# Patient Record
Sex: Female | Born: 2000 | Race: White | Hispanic: No | Smoking: Never smoker
Health system: Southern US, Community
[De-identification: ages and names within clinical notes are randomized; demographics above are authoritative.]

## PROBLEM LIST (undated history)

## (undated) DIAGNOSIS — F32A Depression, unspecified: Secondary | ICD-10-CM

## (undated) HISTORY — DX: Depression, unspecified: F32.A

---
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
## 2021-02-19 ENCOUNTER — Other Ambulatory Visit: Payer: Self-pay

## 2021-02-19 ENCOUNTER — Ambulatory Visit (INDEPENDENT_AMBULATORY_CARE_PROVIDER_SITE_OTHER): Payer: Medicaid Other | Admitting: *Deleted

## 2021-02-19 DIAGNOSIS — Z309 Encounter for contraceptive management, unspecified: Secondary | ICD-10-CM

## 2021-02-19 DIAGNOSIS — Z3042 Encounter for surveillance of injectable contraceptive: Secondary | ICD-10-CM

## 2021-05-08 ENCOUNTER — Other Ambulatory Visit: Payer: Self-pay

## 2021-05-08 ENCOUNTER — Ambulatory Visit: Payer: Self-pay

## 2021-05-08 ENCOUNTER — Ambulatory Visit (INDEPENDENT_AMBULATORY_CARE_PROVIDER_SITE_OTHER): Payer: Medicaid Other

## 2021-05-08 DIAGNOSIS — Z23 Encounter for immunization: Secondary | ICD-10-CM | POA: Diagnosis not present

## 2021-05-08 DIAGNOSIS — Z3042 Encounter for surveillance of injectable contraceptive: Secondary | ICD-10-CM | POA: Diagnosis not present

## 2021-05-08 DIAGNOSIS — Z309 Encounter for contraceptive management, unspecified: Secondary | ICD-10-CM

## 2021-05-08 NOTE — Progress Notes (Signed)
Medroxyprogesterone injection given to left upper outer quadrant.  Patient tolerated well. 

## 2021-07-17 ENCOUNTER — Other Ambulatory Visit: Payer: Self-pay | Admitting: Family Medicine

## 2021-07-17 DIAGNOSIS — F4323 Adjustment disorder with mixed anxiety and depressed mood: Secondary | ICD-10-CM

## 2021-07-17 DIAGNOSIS — Z3009 Encounter for other general counseling and advice on contraception: Secondary | ICD-10-CM

## 2021-07-17 DIAGNOSIS — N921 Excessive and frequent menstruation with irregular cycle: Secondary | ICD-10-CM

## 2021-07-17 NOTE — Telephone Encounter (Signed)
Pt aware and scheduled for appt in jan

## 2021-07-17 NOTE — Telephone Encounter (Signed)
Overdue for checkup.  Please make sure she schedules

## 2021-07-25 ENCOUNTER — Ambulatory Visit (INDEPENDENT_AMBULATORY_CARE_PROVIDER_SITE_OTHER): Payer: Medicaid Other

## 2021-07-25 DIAGNOSIS — Z3042 Encounter for surveillance of injectable contraceptive: Secondary | ICD-10-CM | POA: Diagnosis not present

## 2021-07-25 DIAGNOSIS — Z309 Encounter for contraceptive management, unspecified: Secondary | ICD-10-CM

## 2021-07-25 NOTE — Progress Notes (Signed)
Medroxyprogesterone injection given to right upper outer quadrant.  Patient tolerated well. 

## 2021-08-21 ENCOUNTER — Ambulatory Visit (INDEPENDENT_AMBULATORY_CARE_PROVIDER_SITE_OTHER): Payer: Medicaid Other | Admitting: Family Medicine

## 2021-08-21 ENCOUNTER — Encounter: Payer: Self-pay | Admitting: Family Medicine

## 2021-08-21 VITALS — BP 124/76 | HR 116 | Temp 97.9°F | Ht 62.0 in | Wt 110.0 lb

## 2021-08-21 DIAGNOSIS — F411 Generalized anxiety disorder: Secondary | ICD-10-CM

## 2021-08-21 DIAGNOSIS — F321 Major depressive disorder, single episode, moderate: Secondary | ICD-10-CM | POA: Diagnosis not present

## 2021-08-21 DIAGNOSIS — Z3042 Encounter for surveillance of injectable contraceptive: Secondary | ICD-10-CM

## 2021-08-21 MED ORDER — FLUOXETINE HCL 40 MG PO CAPS: 80.0000 mg | ORAL_CAPSULE | Freq: Every day | ORAL | 3 refills | Status: DC

## 2021-08-21 NOTE — Progress Notes (Signed)
Subjective: CC: Depression and anxiety PCP: Raliegh Ip, DO AGT:XMIWOE Julie Romero is a 21 y.o. female presenting to clinic today for:  1.  Depression/ anxiety Patient notes that she started having increased frequency of anxiety and she self increased to Prozac 80 mg.  She is been on this regimen for about a week now and has really already noticed an improvement in her symptoms.  She would like to continue this regimen if possible.  Denies any GI side effects, SI, HI  2.  Contraception surveillance Patient is compliant with her Depo-Provera.  Next injection due in March.  She has had no menses since being on this medication.  ROS: Per HPI  No Known Allergies Past Medical History:  Diagnosis Date   Depression    Phreesia 08/21/2020    Current Outpatient Medications:    FLUoxetine (PROZAC) 40 MG capsule, TAKE 1 CAPSULE BY MOUTH EVERY DAY, Disp: 90 capsule, Rfl: 3   medroxyPROGESTERone (DEPO-PROVERA) 150 MG/ML injection, BRING TO OFFICE TO HAVE INJECTED EVERY 3 MONTHS., Disp: 1 mL, Rfl: 3 Social History   Socioeconomic History   Marital status: Single    Spouse name: Not on file   Number of children: Not on file   Years of education: Not on file   Highest education level: Not on file  Occupational History   Not on file  Tobacco Use   Smoking status: Never   Smokeless tobacco: Never  Substance and Sexual Activity   Alcohol use: Not on file   Drug use: Not on file   Sexual activity: Yes    Partners: Female  Other Topics Concern   Not on file  Social History Narrative   Not on file   Social Determinants of Health   Financial Resource Strain: Not on file  Food Insecurity: Not on file  Transportation Needs: Not on file  Physical Activity: Not on file  Stress: Not on file  Social Connections: Not on file  Intimate Partner Violence: Not on file   No family history on file.  Objective: Office vital signs reviewed. BP 124/76    Pulse (!) 116    Temp 97.9 F  (36.6 C) (Temporal)    Ht 5\' 2"  (1.575 m)    Wt 110 lb (49.9 kg)    BMI 20.12 kg/m   Physical Examination:  General: Awake, alert, well-appearing female, No acute distress HEENT sclera white. Cardio: Slightly tachycardic with regular rhythm.  S1S2 heard, no murmurs appreciated Pulm: clear to auscultation bilaterally, no wheezes, rhonchi or rales; normal work of breathing on room air GI: soft, non-tender, non-distended, bowel sounds present x4, no hepatomegaly, no splenomegaly, no masses GU: No palpable uterine or adnexal masses appreciated externally  Depression screen [facility] 2/9 08/21/2020 03/15/2019 01/28/2019  Decreased Interest 0 1 2  Down, Depressed, Hopeless 1 2 3   PHQ - 2 Score 1 3 5   Altered sleeping 0 2 2  Tired, decreased energy 0 1 1  Change in appetite 0 3 2  Feeling bad or failure about yourself  1 3 3   Trouble concentrating 1 1 2   Moving slowly or fidgety/restless 0 1 2  Suicidal thoughts 0 - 2  PHQ-9 Score 3 14 19   Difficult doing work/chores Not difficult at all - Very difficult  Some recent data might be hidden   GAD 7 : Generalized Anxiety Score 08/21/2020 03/15/2019 11/02/2018 10/05/2018  Nervous, Anxious, on Edge 1 3 3 3   Control/stop worrying 1 3 3 3   Worry  too much - different things 1 3 3 3   Trouble relaxing 0 2 2 2   Restless 0 2 2 2   Easily annoyed or irritable 0 1 0 1  Afraid - awful might happen 0 1 2 2   Total GAD 7 Score 3 15 15 16   Anxiety Difficulty Not difficult at all - - -   Assessment/ Plan: 21 y.o. female   Depression, major, single episode, moderate (HCC) - Plan: FLUoxetine (PROZAC) 40 MG capsule  Generalized anxiety disorder - Plan: FLUoxetine (PROZAC) 40 MG capsule  Surveillance of contraceptive injection  Anxiety and depression recently exacerbated but seems to be getting better with increased dose of fluoxetine.  Have adjusted her prescription to reflect current dose and have encouraged her to contact me should any concerns arise going  forward.  Doing well on Depo-Provera.  This has been renewed.  No orders of the defined types were placed in this encounter.  No orders of the defined types were placed in this encounter.    , DO [facility] [phone]

## 2021-10-11 ENCOUNTER — Ambulatory Visit (INDEPENDENT_AMBULATORY_CARE_PROVIDER_SITE_OTHER): Payer: Medicaid Other

## 2021-10-11 DIAGNOSIS — Z3042 Encounter for surveillance of injectable contraceptive: Secondary | ICD-10-CM | POA: Diagnosis not present

## 2021-10-11 DIAGNOSIS — Z309 Encounter for contraceptive management, unspecified: Secondary | ICD-10-CM

## 2021-10-11 MED ORDER — MEDROXYPROGESTERONE ACETATE 150 MG/ML IM SUSP
150.0000 mg | Freq: Once | INTRAMUSCULAR | Status: AC
  Administered 2021-10-11: 150 mg via INTRAMUSCULAR

## 2021-10-11 NOTE — Progress Notes (Signed)
Pt give depo provera inj LUOQ 150mg . Pt tol tx well ?

## 2021-12-11 ENCOUNTER — Ambulatory Visit: Payer: Medicaid Other

## 2022-01-01 ENCOUNTER — Ambulatory Visit (INDEPENDENT_AMBULATORY_CARE_PROVIDER_SITE_OTHER): Payer: Medicaid Other

## 2022-01-01 DIAGNOSIS — Z3042 Encounter for surveillance of injectable contraceptive: Secondary | ICD-10-CM | POA: Diagnosis not present

## 2022-01-01 DIAGNOSIS — Z309 Encounter for contraceptive management, unspecified: Secondary | ICD-10-CM

## 2022-01-01 MED ORDER — MEDROXYPROGESTERONE ACETATE 150 MG/ML IM SUSP
150.0000 mg | Freq: Once | INTRAMUSCULAR | Status: AC
  Administered 2022-01-01: 150 mg via INTRAMUSCULAR

## 2022-03-25 ENCOUNTER — Ambulatory Visit (INDEPENDENT_AMBULATORY_CARE_PROVIDER_SITE_OTHER): Payer: Medicaid Other

## 2022-03-25 DIAGNOSIS — Z309 Encounter for contraceptive management, unspecified: Secondary | ICD-10-CM

## 2022-03-25 DIAGNOSIS — Z3042 Encounter for surveillance of injectable contraceptive: Secondary | ICD-10-CM

## 2022-03-25 MED ORDER — MEDROXYPROGESTERONE ACETATE 150 MG/ML IM SUSP
150.0000 mg | INTRAMUSCULAR | Status: AC
  Administered 2022-03-25 – 2022-11-18 (×4): 150 mg via INTRAMUSCULAR

## 2022-03-25 NOTE — Progress Notes (Signed)
Medroxyprogesterone injection given to left upper outer quadrant.  Patient tolerated well. 

## 2022-06-07 ENCOUNTER — Other Ambulatory Visit: Payer: Self-pay | Admitting: Family Medicine

## 2022-06-07 DIAGNOSIS — Z3009 Encounter for other general counseling and advice on contraception: Secondary | ICD-10-CM

## 2022-06-07 DIAGNOSIS — N921 Excessive and frequent menstruation with irregular cycle: Secondary | ICD-10-CM

## 2022-06-11 ENCOUNTER — Ambulatory Visit (INDEPENDENT_AMBULATORY_CARE_PROVIDER_SITE_OTHER): Payer: Medicaid Other

## 2022-06-11 DIAGNOSIS — Z309 Encounter for contraceptive management, unspecified: Secondary | ICD-10-CM

## 2022-06-11 DIAGNOSIS — Z3042 Encounter for surveillance of injectable contraceptive: Secondary | ICD-10-CM | POA: Diagnosis not present

## 2022-06-11 NOTE — Progress Notes (Signed)
Medroxyprogesterone injection given to right upper outer quadrant.  Patient tolerated well. 

## 2022-08-28 ENCOUNTER — Ambulatory Visit (INDEPENDENT_AMBULATORY_CARE_PROVIDER_SITE_OTHER): Payer: Medicaid Other | Admitting: *Deleted

## 2022-08-28 DIAGNOSIS — Z3042 Encounter for surveillance of injectable contraceptive: Secondary | ICD-10-CM | POA: Diagnosis not present

## 2022-08-28 DIAGNOSIS — Z309 Encounter for contraceptive management, unspecified: Secondary | ICD-10-CM

## 2022-08-28 NOTE — Progress Notes (Signed)
Medroxyprogesterone given on left side and patient tolerated well

## 2022-08-28 NOTE — Patient Instructions (Signed)

## 2022-11-18 ENCOUNTER — Ambulatory Visit (INDEPENDENT_AMBULATORY_CARE_PROVIDER_SITE_OTHER): Payer: Self-pay

## 2022-11-18 DIAGNOSIS — Z309 Encounter for contraceptive management, unspecified: Secondary | ICD-10-CM

## 2022-11-18 DIAGNOSIS — Z3042 Encounter for surveillance of injectable contraceptive: Secondary | ICD-10-CM

## 2022-11-18 NOTE — Progress Notes (Signed)
Medroxyprogesterone injection given to right upper outer quadrant.  Patient tolerated well.   

## 2023-01-31 ENCOUNTER — Other Ambulatory Visit: Payer: Self-pay | Admitting: Family Medicine

## 2023-01-31 DIAGNOSIS — Z3009 Encounter for other general counseling and advice on contraception: Secondary | ICD-10-CM

## 2023-01-31 DIAGNOSIS — N921 Excessive and frequent menstruation with irregular cycle: Secondary | ICD-10-CM

## 2023-02-04 ENCOUNTER — Encounter: Payer: Self-pay | Admitting: Nurse Practitioner

## 2023-02-04 ENCOUNTER — Ambulatory Visit (INDEPENDENT_AMBULATORY_CARE_PROVIDER_SITE_OTHER): Payer: Self-pay

## 2023-02-04 ENCOUNTER — Telehealth: Payer: Self-pay | Admitting: *Deleted

## 2023-02-04 ENCOUNTER — Ambulatory Visit (INDEPENDENT_AMBULATORY_CARE_PROVIDER_SITE_OTHER): Payer: Self-pay | Admitting: Nurse Practitioner

## 2023-02-04 VITALS — BP 121/75 | HR 77 | Temp 97.7°F | Ht 62.0 in | Wt 123.8 lb

## 2023-02-04 DIAGNOSIS — E041 Nontoxic single thyroid nodule: Secondary | ICD-10-CM

## 2023-02-04 DIAGNOSIS — Z309 Encounter for contraceptive management, unspecified: Secondary | ICD-10-CM

## 2023-02-04 DIAGNOSIS — Z3200 Encounter for pregnancy test, result unknown: Secondary | ICD-10-CM

## 2023-02-04 DIAGNOSIS — Z3042 Encounter for surveillance of injectable contraceptive: Secondary | ICD-10-CM

## 2023-02-04 LAB — PREGNANCY, URINE: Preg Test, Ur: NEGATIVE

## 2023-02-04 MED ORDER — MEDROXYPROGESTERONE ACETATE 150 MG/ML IM SUSP
150.0000 mg | INTRAMUSCULAR | Status: AC
  Administered 2023-02-04 – 2023-09-24 (×4): 150 mg via INTRAMUSCULAR

## 2023-02-04 MED ORDER — MEDROXYPROGESTERONE ACETATE 104 MG/0.65ML ~~LOC~~ SUSY: 104.0000 mg | PREFILLED_SYRINGE | Freq: Once | SUBCUTANEOUS | Status: DC

## 2023-02-04 NOTE — Progress Notes (Signed)
   Acute Office Visit  Subjective:     Patient ID: Julie Romero, female    DOB: 20-Oct-2000, 22 y.o.   MRN: [id number]  Chief Complaint  Patient presents with   knot on throat    Noticed knot on right side of throat last week.     HPI Julie Romero is a 22 yrs old  female present here for an acute  for concerns of thyrid nodule. PMH depression and GAD currently on Prozac.  "my finace and my famly noticed a nodule on the right side of my thyroid". She denies, trouble swallowing., difficulty breathing, when lying down., neck pain or jaw or ear pain. She has been getting Depo shot for about 4 yrs, HCG negative.  ROS Negative unless indicated in HPI    Objective:    BP 121/75   Pulse 77   Temp 97.7 F (36.5 C) (Temporal)   Ht 5\' 2"  (1.575 m)   Wt 123 lb 12.8 oz (56.2 kg)   SpO2 97%   BMI 22.64 kg/m  BP Readings from Last 3 Encounters:  02/04/23 121/75  08/21/21 124/76  08/21/20 116/76   Wt Readings from Last 3 Encounters:  02/04/23 123 lb 12.8 oz (56.2 kg)  08/21/21 110 lb (49.9 kg) (16 %, Z= -0.99)*  08/21/20 102 lb (46.3 kg) (7 %, Z= -1.50)*   * Growth percentiles are based on CDC (Girls, 2-20 Years) data.      Physical Exam Vitals and nursing note reviewed.  Constitutional:      Appearance: Normal appearance.  HENT:     Head: Normocephalic and atraumatic.  Eyes:     Extraocular Movements: Extraocular movements intact.     Pupils: Pupils are equal, round, and reactive to light.  Neck:     Thyroid: No thyroid tenderness.     Trachea: Trachea normal.      Comments: <1cm nodule appreciated Cardiovascular:     Rate and Rhythm: Normal rate.     Heart sounds: Normal heart sounds.  Pulmonary:     Effort: Pulmonary effort is normal.     Breath sounds: Normal breath sounds.  Musculoskeletal:        General: Normal range of motion.     Cervical back: Normal range of motion.  Skin:    General: Skin is warm and dry.     Findings: No rash.  Neurological:      General: No focal deficit present.     Mental Status: She is alert and oriented to person, place, and time. Mental status is at baseline.     No results found for any visits on 02/04/23.      Assessment & Plan:  Encounter for pregnancy test, result unknown -     Pregnancy, urine -     Thyroid Panel With TSH  Right thyroid nodule -     Thyroid Panel With TSH  Well 22 yrs old no acute distress TSH panel ordered, based on results may need thyroid US - MDD/Gad well controled on Prozac Plan of care discuss with client Return in about 2 weeks (around 02/18/2023) for follow-up to dsicuss lab results.  Arrie Aran Santa Lighter, DNP [facility] [address] [place], [place] [address] [phone]

## 2023-02-04 NOTE — Telephone Encounter (Signed)
She can come in for refill appt.

## 2023-02-04 NOTE — Telephone Encounter (Signed)
Pt here today for Depo shot, Next shot will be in Sept Her last PE was 08/2021, not able to schedule her until 08/2023 Please advise on refills on her Depo Provera for future shots until her PE appt.

## 2023-02-05 LAB — THYROID PANEL WITH TSH
Free Thyroxine Index: 2.1 (ref 1.2–4.9)
T3 Uptake Ratio: 29 % (ref 24–39)
T4, Total: 7.4 ug/dL (ref 4.5–12.0)
TSH: 1.26 u[IU]/mL (ref 0.450–4.500)

## 2023-02-23 ENCOUNTER — Encounter: Payer: Self-pay | Admitting: Family Medicine

## 2023-02-23 ENCOUNTER — Ambulatory Visit (INDEPENDENT_AMBULATORY_CARE_PROVIDER_SITE_OTHER): Payer: Self-pay | Admitting: Family Medicine

## 2023-02-23 ENCOUNTER — Other Ambulatory Visit: Payer: Self-pay | Admitting: Family Medicine

## 2023-02-23 DIAGNOSIS — F411 Generalized anxiety disorder: Secondary | ICD-10-CM

## 2023-02-23 DIAGNOSIS — F321 Major depressive disorder, single episode, moderate: Secondary | ICD-10-CM

## 2023-02-23 DIAGNOSIS — N921 Excessive and frequent menstruation with irregular cycle: Secondary | ICD-10-CM

## 2023-02-23 DIAGNOSIS — Z3009 Encounter for other general counseling and advice on contraception: Secondary | ICD-10-CM

## 2023-02-23 MED ORDER — FLUOXETINE HCL 40 MG PO CAPS
80.0000 mg | ORAL_CAPSULE | Freq: Every day | ORAL | 3 refills | Status: DC
Start: 2023-02-23 — End: 2023-09-04

## 2023-02-23 MED ORDER — MEDROXYPROGESTERONE ACETATE 150 MG/ML IM SUSP
INTRAMUSCULAR | 4 refills | Status: DC
Start: 2023-02-23 — End: 2023-09-04

## 2023-02-23 NOTE — Progress Notes (Signed)
Did not need to be seen.  Just needed refills.  Self pay.    Recent Results (from the past 2160 hour(s))  Pregnancy, urine     Status: None   Collection Time: 02/04/23  8:10 AM  Result Value Ref Range   Preg Test, Ur Negative Negative  Thyroid Panel With TSH     Status: None   Collection Time: 02/04/23  8:20 AM  Result Value Ref Range   TSH 1.260 0.450 - 4.500 uIU/mL   T4, Total 7.4 4.5 - 12.0 ug/dL   T3 Uptake Ratio 29 24 - 39 %   Free Thyroxine Index 2.1 1.2 - 4.9

## 2023-04-22 ENCOUNTER — Ambulatory Visit (INDEPENDENT_AMBULATORY_CARE_PROVIDER_SITE_OTHER): Payer: 59

## 2023-04-22 DIAGNOSIS — Z23 Encounter for immunization: Secondary | ICD-10-CM | POA: Diagnosis not present

## 2023-04-22 DIAGNOSIS — Z309 Encounter for contraceptive management, unspecified: Secondary | ICD-10-CM

## 2023-04-22 DIAGNOSIS — Z3042 Encounter for surveillance of injectable contraceptive: Secondary | ICD-10-CM | POA: Diagnosis not present

## 2023-04-22 NOTE — Progress Notes (Signed)
Pt given depo and flu shot. Tolerated well.

## 2023-07-08 ENCOUNTER — Ambulatory Visit (INDEPENDENT_AMBULATORY_CARE_PROVIDER_SITE_OTHER): Payer: 59 | Admitting: *Deleted

## 2023-07-08 DIAGNOSIS — Z3042 Encounter for surveillance of injectable contraceptive: Secondary | ICD-10-CM | POA: Diagnosis not present

## 2023-07-08 DIAGNOSIS — Z309 Encounter for contraceptive management, unspecified: Secondary | ICD-10-CM

## 2023-07-08 NOTE — Progress Notes (Signed)
Patient is in office today for a nurse visit for Birth Control Injection. Patient Injection was given in the  Right upper quad. gluteus. Patient tolerated injection well.

## 2023-09-04 ENCOUNTER — Other Ambulatory Visit (HOSPITAL_COMMUNITY)
Admission: RE | Admit: 2023-09-04 | Discharge: 2023-09-04 | Disposition: A | Discharge status: Home / Self Care | Payer: 59 | Source: Ambulatory Visit | Attending: Family Medicine | Admitting: Family Medicine

## 2023-09-04 ENCOUNTER — Encounter: Payer: Self-pay | Admitting: Family Medicine

## 2023-09-04 ENCOUNTER — Ambulatory Visit (INDEPENDENT_AMBULATORY_CARE_PROVIDER_SITE_OTHER): Payer: 59 | Admitting: Family Medicine

## 2023-09-04 VITALS — BP 122/77 | HR 73 | Temp 98.3°F | Ht 62.0 in | Wt 126.8 lb

## 2023-09-04 DIAGNOSIS — N921 Excessive and frequent menstruation with irregular cycle: Secondary | ICD-10-CM | POA: Diagnosis not present

## 2023-09-04 DIAGNOSIS — Z124 Encounter for screening for malignant neoplasm of cervix: Secondary | ICD-10-CM | POA: Diagnosis not present

## 2023-09-04 DIAGNOSIS — F321 Major depressive disorder, single episode, moderate: Secondary | ICD-10-CM

## 2023-09-04 DIAGNOSIS — Z0000 Encounter for general adult medical examination without abnormal findings: Secondary | ICD-10-CM

## 2023-09-04 DIAGNOSIS — Z3009 Encounter for other general counseling and advice on contraception: Secondary | ICD-10-CM | POA: Diagnosis not present

## 2023-09-04 DIAGNOSIS — Z114 Encounter for screening for human immunodeficiency virus [HIV]: Secondary | ICD-10-CM | POA: Diagnosis not present

## 2023-09-04 DIAGNOSIS — Z0001 Encounter for general adult medical examination with abnormal findings: Secondary | ICD-10-CM | POA: Diagnosis not present

## 2023-09-04 DIAGNOSIS — Z1159 Encounter for screening for other viral diseases: Secondary | ICD-10-CM

## 2023-09-04 DIAGNOSIS — F411 Generalized anxiety disorder: Secondary | ICD-10-CM | POA: Diagnosis not present

## 2023-09-04 MED ORDER — MEDROXYPROGESTERONE ACETATE 150 MG/ML IM SUSP
INTRAMUSCULAR | 4 refills | Status: DC
Start: 2023-09-04 — End: 2024-03-07

## 2023-09-04 MED ORDER — FLUOXETINE HCL 40 MG PO CAPS
80.0000 mg | ORAL_CAPSULE | Freq: Every day | ORAL | 3 refills | Status: DC
Start: 2023-09-04 — End: 2024-06-30

## 2023-09-04 NOTE — Patient Instructions (Addendum)
I recommend screening Hep C and HIV testing You have labs in and you may get them done at your leisure.

## 2023-09-04 NOTE — Progress Notes (Signed)
Julie Romero is a 23 y.o. female presents to office today for annual physical exam examination.    Concerns today include: 1.  None.  She reports that she is doing well.  She bought a house with her boyfriend recently.  She is considering some family-planning but they have not gotten there just yet.  They are expecting to be married within the next year.  She is compliant with Depo-Provera.  Reports no pelvic concerns including discharge, abnormal uterine bleeding or dyspareunia.  Occupation: Works at [employer] locally, Marital status: Has a fianc, Substance use: None Health Maintenance Due  Topic Date Due   CHLAMYDIA SCREENING  Never done   HIV Screening  Never done   Hepatitis C Screening  Never done   Cervical Cancer Screening (Pap smear)  Never done   Refills needed today: All  Immunization History  Administered Date(s) Administered   DTaP 08/02/2002, 09/20/2002, 11/01/2002, 01/04/2004, 03/26/2007   HIB (PRP-OMP) 08/02/2002, 09/20/2002, 02/03/2003, 06/13/2003   HPV 9-valent 07/16/2015, 01/04/2016   HPV Quadrivalent 05/15/2015   Hepatitis A 03/26/2007   Hepatitis B 05/19/2002, 06/16/2002, 11/01/2002   Hpv-Unspecified 05/15/2015, 07/16/2015, 01/04/2016   IPV 08/02/2002, 09/20/2002, 02/03/2003, 03/26/2007   Influenza, Seasonal, Injecte, Preservative Fre 04/22/2023   Influenza,Quad,Nasal, Live 06/01/2013, 05/30/2014   Influenza,inj,Quad PF,6+ Mos 05/15/2015, 05/06/2016, 06/02/2017, 06/07/2018, 05/02/2019, 06/04/2020, 05/08/2021   Influenza-Unspecified 05/06/2016, 06/02/2017, 06/07/2018   MMR 06/13/2003, 03/26/2007   Meningococcal Conjugate 02/08/2015   Meningococcal Mcv4o 04/04/2020   Meningococcal polysaccharide vaccine (MPSV4) 02/08/2015   PFIZER(Purple Top)SARS-COV-2 Vaccination 11/24/2019, 12/16/2019, 08/12/2020   Pneumococcal Conjugate-13 08/02/2002, 09/20/2002, 11/01/2002, 06/13/2003   Tdap 02/08/2015   Varicella 06/13/2003, 03/26/2007   Past Medical History:   Diagnosis Date   Depression    [employer] 08/21/2020   Social History   Socioeconomic History   Marital status: Significant Other    Spouse name: Not on file   Number of children: Not on file   Years of education: Not on file   Highest education level: Not on file  Occupational History   Not on file  Tobacco Use   Smoking status: Never   Smokeless tobacco: Never  Vaping Use   Vaping status: Never Used  Substance and Sexual Activity   Alcohol use: Not on file   Drug use: Not on file   Sexual activity: Yes    Partners: Female    Birth control/protection: Injection  Other Topics Concern   Not on file  Social History Narrative   Not on file   Social Drivers of Health   Financial Resource Strain: Not on file  Food Insecurity: Not on file  Transportation Needs: Not on file  Physical Activity: Not on file  Stress: Not on file  Social Connections: Not on file  Intimate Partner Violence: Not on file   History reviewed. No pertinent surgical history. History reviewed. No pertinent family history.  Current Outpatient Medications:    FLUoxetine (PROZAC) 40 MG capsule, Take 2 capsules (80 mg total) by mouth daily., Disp: 180 capsule, Rfl: 3   medroxyPROGESTERone (DEPO-PROVERA) 150 MG/ML injection, BRING TO OFFICE TO HAVE INJECTED EVERY 3 MONTHS., Disp: 1 mL, Rfl: 4  Current Facility-Administered Medications:    medroxyPROGESTERone (DEPO-PROVERA) injection 150 mg, 150 mg, Intramuscular, Q90 days, Jaevian Shean M, DO, 150 mg at 07/08/23 1607  No Known Allergies   ROS: Review of Systems A comprehensive review of systems was negative.    Physical exam BP 122/77   Pulse 73   Temp  98.3 F (36.8 C)   Ht 5\' 2"  (1.575 m)   Wt 126 lb 12.8 oz (57.5 kg)   SpO2 99%   BMI 23.19 kg/m  General appearance: alert, cooperative, appears stated age, and no distress Head: Normocephalic, without obvious abnormality, atraumatic Eyes: negative findings: lids and lashes normal,  conjunctivae and sclerae normal, corneas clear, and pupils equal, round, reactive to light and accomodation Ears: normal TM's and external ear canals both ears Nose: Nares normal. Septum midline. Mucosa normal. No drainage or sinus tenderness.,  Has several piercings including 1 along the bridge Throat: lips, mucosa, and tongue normal; teeth and gums normal Neck: no adenopathy, supple, symmetrical, trachea midline, and thyroid not enlarged, symmetric, no tenderness/mass/nodules Back: symmetric, no curvature. ROM normal. No CVA tenderness. Lungs: clear to auscultation bilaterally Heart: regular rate and rhythm, S1, S2 normal, no murmur, click, rub or gallop Abdomen: soft, non-tender; bowel sounds normal; no masses,  no organomegaly Pelvic: cervix normal in appearance, external genitalia normal, no adnexal masses or tenderness, no cervical motion tenderness, rectovaginal septum normal, uterus normal size, shape, and consistency, vagina normal without discharge, and scant bleeding after pap Extremities: extremities normal, atraumatic, no cyanosis or edema Pulses: 2+ and symmetric Skin: Skin color, texture, turgor normal. No rashes or lesions Lymph nodes: Cervical, supraclavicular, and axillary nodes normal. Neurologic: Grossly normal      09/04/2023    3:17 PM 08/21/2020   11:26 AM 03/15/2019    3:40 PM  Depression screen PHQ 2/9  Decreased Interest 0 0 1  Down, Depressed, Hopeless 0 1 2  PHQ - 2 Score 0 1 3  Altered sleeping 0 0 2  Tired, decreased energy 0 0 1  Change in appetite 0 0 3  Feeling bad or failure about yourself  0 1 3  Trouble concentrating 0 1 1  Moving slowly or fidgety/restless 0 0 1  Suicidal thoughts 0 0   PHQ-9 Score 0 3 14  Difficult doing work/chores Not difficult at all Not difficult at all       09/04/2023    3:17 PM 08/21/2020   11:25 AM 03/15/2019    3:40 PM 11/02/2018   10:13 AM  GAD 7 : Generalized Anxiety Score  Nervous, Anxious, on Edge 0 1 3 3    Control/stop worrying 0 1 3 3   Worry too much - different things 0 1 3 3   Trouble relaxing 0 0 2 2  Restless 0 0 2 2  Easily annoyed or irritable 0 0 1 0  Afraid - awful might happen 0 0 1 2  Total GAD 7 Score 0 3 15 15   Anxiety Difficulty  Not difficult at all       Assessment/ Plan: Julie Romero here for annual physical exam.   Annual physical exam  Screening for malignant neoplasm of cervix - Plan: Cytology - PAP, CANCELED: Cytology - PAP  Depression, major, single episode, moderate (HCC) - Plan: FLUoxetine (PROZAC) 40 MG capsule, CMP14+EGFR, CANCELED: CMP14+EGFR  Generalized anxiety disorder - Plan: FLUoxetine (PROZAC) 40 MG capsule, CMP14+EGFR, CANCELED: CMP14+EGFR  Menometrorrhagia - Plan: medroxyPROGESTERone (DEPO-PROVERA) 150 MG/ML injection, CMP14+EGFR, CBC, CANCELED: CMP14+EGFR, CANCELED: CBC  General counseling and advice on female contraception - Plan: medroxyPROGESTERone (DEPO-PROVERA) 150 MG/ML injection, CMP14+EGFR, CANCELED: CMP14+EGFR  Screening for HIV (human immunodeficiency virus) - Plan: HIV antibody (with reflex), CANCELED: HIV antibody (with reflex)  Encounter for hepatitis C screening test for low risk patient - Plan: Hepatitis C antibody, CANCELED: Hepatitis C antibody  First  pap completed.  Labs ordered. She was not amenable today due to fear of needles.  Willing to get at a later date. No known exposures  Mood stable. Meds renewed.  Depo renewed. Discussed family planning, etc  Counseled on healthy lifestyle choices, including diet (rich in fruits, vegetables and lean meats and low in salt and simple carbohydrates) and exercise (at least 30 minutes of moderate physical activity daily).  Patient to follow up 1 year for CPE  Cayman Kielbasa M. Nadine Counts, DO

## 2023-09-09 ENCOUNTER — Encounter: Payer: Self-pay | Admitting: Family Medicine

## 2023-09-09 LAB — CYTOLOGY - PAP
Chlamydia: NEGATIVE
Comment: NEGATIVE
Comment: NORMAL
Diagnosis: NEGATIVE
Neisseria Gonorrhea: NEGATIVE

## 2023-09-24 ENCOUNTER — Ambulatory Visit (INDEPENDENT_AMBULATORY_CARE_PROVIDER_SITE_OTHER): Payer: 59 | Admitting: Family Medicine

## 2023-09-24 DIAGNOSIS — Z3042 Encounter for surveillance of injectable contraceptive: Secondary | ICD-10-CM

## 2023-09-24 DIAGNOSIS — Z309 Encounter for contraceptive management, unspecified: Secondary | ICD-10-CM

## 2023-12-11 ENCOUNTER — Ambulatory Visit (INDEPENDENT_AMBULATORY_CARE_PROVIDER_SITE_OTHER): Payer: 59

## 2023-12-11 DIAGNOSIS — Z309 Encounter for contraceptive management, unspecified: Secondary | ICD-10-CM

## 2023-12-11 MED ORDER — MEDROXYPROGESTERONE ACETATE 150 MG/ML IM SUSY
150.0000 mg | PREFILLED_SYRINGE | INTRAMUSCULAR | Status: AC
  Administered 2023-12-11: 150 mg via INTRAMUSCULAR

## 2023-12-11 NOTE — Progress Notes (Signed)
Patient is in office today for a nurse visit for Birth Control Injection. Patient Injection was given in the  Right upper quad. gluteus. Patient tolerated injection well.

## 2024-03-04 ENCOUNTER — Ambulatory Visit (INDEPENDENT_AMBULATORY_CARE_PROVIDER_SITE_OTHER)

## 2024-03-04 DIAGNOSIS — Z309 Encounter for contraceptive management, unspecified: Secondary | ICD-10-CM | POA: Diagnosis not present

## 2024-03-04 DIAGNOSIS — Z3042 Encounter for surveillance of injectable contraceptive: Secondary | ICD-10-CM

## 2024-03-04 MED ORDER — MEDROXYPROGESTERONE ACETATE 150 MG/ML IM SUSY
150.0000 mg | PREFILLED_SYRINGE | INTRAMUSCULAR | Status: AC
  Administered 2024-03-04: 150 mg via INTRAMUSCULAR

## 2024-03-04 NOTE — Progress Notes (Signed)
 Patient is in office today for a nurse visit for Birth Control Injection. Patient Injection was given in the  Left upper quad. gluteus. Patient tolerated injection well.

## 2024-03-06 ENCOUNTER — Other Ambulatory Visit: Payer: Self-pay | Admitting: Family Medicine

## 2024-03-06 DIAGNOSIS — Z3009 Encounter for other general counseling and advice on contraception: Secondary | ICD-10-CM

## 2024-03-06 DIAGNOSIS — N921 Excessive and frequent menstruation with irregular cycle: Secondary | ICD-10-CM

## 2024-05-25 ENCOUNTER — Ambulatory Visit

## 2024-06-29 ENCOUNTER — Other Ambulatory Visit: Payer: Self-pay | Admitting: Family Medicine

## 2024-06-29 DIAGNOSIS — F411 Generalized anxiety disorder: Secondary | ICD-10-CM

## 2024-06-29 DIAGNOSIS — F321 Major depressive disorder, single episode, moderate: Secondary | ICD-10-CM

## 2024-09-05 ENCOUNTER — Encounter: Payer: 59 | Admitting: Family Medicine
